# Patient Record
Sex: Female | Born: 1960 | Race: Black or African American | Hispanic: No
Health system: Southern US, Community
[De-identification: ages and names within clinical notes are randomized; demographics above are authoritative.]

---

## 2020-08-21 ENCOUNTER — Emergency Department (HOSPITAL_COMMUNITY): Payer: BC Managed Care – PPO

## 2020-08-21 ENCOUNTER — Inpatient Hospital Stay (HOSPITAL_COMMUNITY): Payer: BC Managed Care – PPO

## 2020-08-21 ENCOUNTER — Inpatient Hospital Stay (HOSPITAL_COMMUNITY)
Admission: EM | Admit: 2020-08-21 | Discharge: 2020-09-06 | DRG: 208 | Disposition: E | Payer: BC Managed Care – PPO | Attending: Surgery | Admitting: Surgery

## 2020-08-21 DIAGNOSIS — G253 Myoclonus: Secondary | ICD-10-CM | POA: Diagnosis present

## 2020-08-21 DIAGNOSIS — R34 Anuria and oliguria: Secondary | ICD-10-CM | POA: Diagnosis present

## 2020-08-21 DIAGNOSIS — R569 Unspecified convulsions: Secondary | ICD-10-CM | POA: Diagnosis not present

## 2020-08-21 DIAGNOSIS — S272XXA Traumatic hemopneumothorax, initial encounter: Principal | ICD-10-CM | POA: Diagnosis present

## 2020-08-21 DIAGNOSIS — R578 Other shock: Secondary | ICD-10-CM | POA: Diagnosis present

## 2020-08-21 DIAGNOSIS — S2242XA Multiple fractures of ribs, left side, initial encounter for closed fracture: Secondary | ICD-10-CM | POA: Diagnosis present

## 2020-08-21 DIAGNOSIS — Y92410 Unspecified street and highway as the place of occurrence of the external cause: Secondary | ICD-10-CM | POA: Diagnosis not present

## 2020-08-21 DIAGNOSIS — N179 Acute kidney failure, unspecified: Secondary | ICD-10-CM | POA: Diagnosis present

## 2020-08-21 DIAGNOSIS — S27332A Laceration of lung, bilateral, initial encounter: Secondary | ICD-10-CM | POA: Diagnosis present

## 2020-08-21 DIAGNOSIS — R40243 Glasgow coma scale score 3-8, unspecified time: Secondary | ICD-10-CM | POA: Diagnosis present

## 2020-08-21 DIAGNOSIS — Z7189 Other specified counseling: Secondary | ICD-10-CM

## 2020-08-21 DIAGNOSIS — I639 Cerebral infarction, unspecified: Secondary | ICD-10-CM | POA: Diagnosis present

## 2020-08-21 DIAGNOSIS — T1490XA Injury, unspecified, initial encounter: Secondary | ICD-10-CM

## 2020-08-21 DIAGNOSIS — I469 Cardiac arrest, cause unspecified: Secondary | ICD-10-CM | POA: Diagnosis present

## 2020-08-21 DIAGNOSIS — J942 Hemothorax: Secondary | ICD-10-CM

## 2020-08-21 DIAGNOSIS — S022XXA Fracture of nasal bones, initial encounter for closed fracture: Secondary | ICD-10-CM | POA: Diagnosis present

## 2020-08-21 DIAGNOSIS — T797XXA Traumatic subcutaneous emphysema, initial encounter: Secondary | ICD-10-CM | POA: Diagnosis present

## 2020-08-21 DIAGNOSIS — E874 Mixed disorder of acid-base balance: Secondary | ICD-10-CM | POA: Diagnosis present

## 2020-08-21 DIAGNOSIS — S02401A Maxillary fracture, unspecified, initial encounter for closed fracture: Secondary | ICD-10-CM | POA: Diagnosis present

## 2020-08-21 DIAGNOSIS — G40901 Epilepsy, unspecified, not intractable, with status epilepticus: Secondary | ICD-10-CM | POA: Diagnosis not present

## 2020-08-21 DIAGNOSIS — U071 COVID-19: Secondary | ICD-10-CM | POA: Diagnosis present

## 2020-08-21 DIAGNOSIS — J9601 Acute respiratory failure with hypoxia: Secondary | ICD-10-CM | POA: Diagnosis present

## 2020-08-21 DIAGNOSIS — S02831A Fracture of medial orbital wall, right side, initial encounter for closed fracture: Secondary | ICD-10-CM | POA: Diagnosis present

## 2020-08-21 DIAGNOSIS — Z8673 Personal history of transient ischemic attack (TIA), and cerebral infarction without residual deficits: Secondary | ICD-10-CM

## 2020-08-21 DIAGNOSIS — J9382 Other air leak: Secondary | ICD-10-CM | POA: Diagnosis present

## 2020-08-21 DIAGNOSIS — Z09 Encounter for follow-up examination after completed treatment for conditions other than malignant neoplasm: Secondary | ICD-10-CM

## 2020-08-21 DIAGNOSIS — T50905A Adverse effect of unspecified drugs, medicaments and biological substances, initial encounter: Secondary | ICD-10-CM | POA: Diagnosis not present

## 2020-08-21 DIAGNOSIS — G928 Other toxic encephalopathy: Secondary | ICD-10-CM | POA: Diagnosis not present

## 2020-08-21 DIAGNOSIS — J939 Pneumothorax, unspecified: Secondary | ICD-10-CM

## 2020-08-21 DIAGNOSIS — G931 Anoxic brain damage, not elsewhere classified: Secondary | ICD-10-CM | POA: Diagnosis present

## 2020-08-21 DIAGNOSIS — Z4659 Encounter for fitting and adjustment of other gastrointestinal appliance and device: Secondary | ICD-10-CM

## 2020-08-21 DIAGNOSIS — J9690 Respiratory failure, unspecified, unspecified whether with hypoxia or hypercapnia: Secondary | ICD-10-CM

## 2020-08-21 DIAGNOSIS — I4901 Ventricular fibrillation: Secondary | ICD-10-CM | POA: Diagnosis present

## 2020-08-21 LAB — I-STAT ARTERIAL BLOOD GAS, ED
Acid-base deficit: 23 mmol/L — ABNORMAL HIGH (ref 0.0–2.0)
Bicarbonate: 12.7 mmol/L — ABNORMAL LOW (ref 20.0–28.0)
Calcium, Ion: 1.02 mmol/L — ABNORMAL LOW (ref 1.15–1.40)
HCT: 41 % (ref 36.0–46.0)
Hemoglobin: 13.9 g/dL (ref 12.0–15.0)
O2 Saturation: 100 %
Patient temperature: 89
Potassium: 5 mmol/L (ref 3.5–5.1)
Sodium: 140 mmol/L (ref 135–145)
TCO2: 15 mmol/L — ABNORMAL LOW (ref 22–32)
pCO2 arterial: 60.7 mmHg — ABNORMAL HIGH (ref 32.0–48.0)
pH, Arterial: 6.884 — CL (ref 7.350–7.450)
pO2, Arterial: 328 mmHg — ABNORMAL HIGH (ref 83.0–108.0)

## 2020-08-21 LAB — POCT I-STAT 7, (LYTES, BLD GAS, ICA,H+H)
Acid-base deficit: 23 mmol/L — ABNORMAL HIGH (ref 0.0–2.0)
Bicarbonate: 10.9 mmol/L — ABNORMAL LOW (ref 20.0–28.0)
Calcium, Ion: 1.03 mmol/L — ABNORMAL LOW (ref 1.15–1.40)
HCT: 46 % (ref 36.0–46.0)
Hemoglobin: 15.6 g/dL — ABNORMAL HIGH (ref 12.0–15.0)
O2 Saturation: 85 %
Patient temperature: 88.9
Potassium: 5.5 mmol/L — ABNORMAL HIGH (ref 3.5–5.1)
Sodium: 141 mmol/L (ref 135–145)
TCO2: 13 mmol/L — ABNORMAL LOW (ref 22–32)
pCO2 arterial: 43.5 mmHg (ref 32.0–48.0)
pH, Arterial: 6.965 — CL (ref 7.350–7.450)
pO2, Arterial: 59 mmHg — ABNORMAL LOW (ref 83.0–108.0)

## 2020-08-21 LAB — COMPREHENSIVE METABOLIC PANEL
ALT: 34 U/L (ref 0–44)
AST: 140 U/L — ABNORMAL HIGH (ref 15–41)
Albumin: 2.3 g/dL — ABNORMAL LOW (ref 3.5–5.0)
Alkaline Phosphatase: 77 U/L (ref 38–126)
Anion gap: 23 — ABNORMAL HIGH (ref 5–15)
BUN: 19 mg/dL (ref 6–20)
CO2: 13 mmol/L — ABNORMAL LOW (ref 22–32)
Calcium: 7.2 mg/dL — ABNORMAL LOW (ref 8.9–10.3)
Chloride: 108 mmol/L (ref 98–111)
Creatinine, Ser: 1.51 mg/dL — ABNORMAL HIGH (ref 0.44–1.00)
GFR, Estimated: 40 mL/min — ABNORMAL LOW (ref >60)
Glucose, Bld: 216 mg/dL — ABNORMAL HIGH (ref 70–99)
Potassium: 4.3 mmol/L (ref 3.5–5.1)
Sodium: 144 mmol/L (ref 135–145)
Total Bilirubin: 0.8 mg/dL (ref 0.3–1.2)
Total Protein: 4.1 g/dL — ABNORMAL LOW (ref 6.5–8.1)

## 2020-08-21 LAB — CBC
HCT: 38.3 % (ref 36.0–46.0)
HCT: 47.6 % — ABNORMAL HIGH (ref 36.0–46.0)
Hemoglobin: 12 g/dL (ref 12.0–15.0)
Hemoglobin: 14.8 g/dL (ref 12.0–15.0)
MCH: 32.5 pg (ref 26.0–34.0)
MCH: 29.2 pg (ref 26.0–34.0)
MCHC: 31.3 g/dL (ref 30.0–36.0)
MCHC: 31.1 g/dL (ref 30.0–36.0)
MCV: 103.8 fL — ABNORMAL HIGH (ref 80.0–100.0)
MCV: 94.1 fL (ref 80.0–100.0)
Platelets: 141 10*3/uL — ABNORMAL LOW (ref 150–400)
Platelets: 163 10*3/uL (ref 150–400)
RBC: 3.69 MIL/uL — ABNORMAL LOW (ref 3.87–5.11)
RBC: 5.06 MIL/uL (ref 3.87–5.11)
RDW: 14.2 % (ref 11.5–15.5)
RDW: 18.4 % — ABNORMAL HIGH (ref 11.5–15.5)
WBC: 7.4 10*3/uL (ref 4.0–10.5)
WBC: 16.2 10*3/uL — ABNORMAL HIGH (ref 4.0–10.5)
nRBC: 0.4 % — ABNORMAL HIGH (ref 0.0–0.2)
nRBC: 0 % (ref 0.0–0.2)

## 2020-08-21 LAB — URINALYSIS, ROUTINE W REFLEX MICROSCOPIC
Bacteria, UA: NONE SEEN
Bilirubin Urine: NEGATIVE
Glucose, UA: 50 mg/dL — AB
Ketones, ur: NEGATIVE mg/dL
Leukocytes,Ua: NEGATIVE
Nitrite: NEGATIVE
Protein, ur: 100 mg/dL — AB
Specific Gravity, Urine: 1.015 (ref 1.005–1.030)
pH: 6 (ref 5.0–8.0)

## 2020-08-21 LAB — HIV ANTIBODY (ROUTINE TESTING W REFLEX): HIV Screen 4th Generation wRfx: NONREACTIVE

## 2020-08-21 LAB — PROTIME-INR
INR: 1.5 — ABNORMAL HIGH (ref 0.8–1.2)
Prothrombin Time: 17.4 seconds — ABNORMAL HIGH (ref 11.4–15.2)

## 2020-08-21 LAB — I-STAT CHEM 8, ED
BUN: 22 mg/dL — ABNORMAL HIGH (ref 6–20)
Calcium, Ion: 0.77 mmol/L — CL (ref 1.15–1.40)
Chloride: 108 mmol/L (ref 98–111)
Creatinine, Ser: 1.5 mg/dL — ABNORMAL HIGH (ref 0.44–1.00)
Glucose, Bld: 198 mg/dL — ABNORMAL HIGH (ref 70–99)
HCT: 33 % — ABNORMAL LOW (ref 36.0–46.0)
Hemoglobin: 11.2 g/dL — ABNORMAL LOW (ref 12.0–15.0)
Potassium: 4.2 mmol/L (ref 3.5–5.1)
Sodium: 143 mmol/L (ref 135–145)
TCO2: 16 mmol/L — ABNORMAL LOW (ref 22–32)

## 2020-08-21 LAB — RESP PANEL BY RT-PCR (FLU A&B, COVID) ARPGX2
Influenza A by PCR: NEGATIVE
Influenza B by PCR: NEGATIVE
SARS Coronavirus 2 by RT PCR: POSITIVE — AB

## 2020-08-21 LAB — ETHANOL: Alcohol, Ethyl (B): 238 mg/dL — ABNORMAL HIGH (ref <10)

## 2020-08-21 LAB — ABO/RH: ABO/RH(D): B POS

## 2020-08-21 MED ORDER — LEVETIRACETAM IN NACL 500 MG/100ML IV SOLN
500.0000 mg | Freq: Two times a day (BID) | INTRAVENOUS | Status: DC
  Administered 2020-08-21 – 2020-08-22 (×2): 500 mg via INTRAVENOUS
  Filled 2020-08-21 (×3): qty 100

## 2020-08-21 MED ORDER — IOHEXOL 300 MG/ML  SOLN
100.0000 mL | Freq: Once | INTRAMUSCULAR | Status: AC | PRN
  Administered 2020-08-21: 100 mL via INTRAVENOUS

## 2020-08-21 MED ORDER — ENOXAPARIN SODIUM 30 MG/0.3ML ~~LOC~~ SOLN
30.0000 mg | Freq: Two times a day (BID) | SUBCUTANEOUS | Status: DC
  Administered 2020-08-23 (×2): 30 mg via SUBCUTANEOUS
  Filled 2020-08-21 (×2): qty 0.3

## 2020-08-21 MED ORDER — SODIUM CHLORIDE 0.9 % IV SOLN: 250.0000 mL | INTRAVENOUS | Status: DC

## 2020-08-21 MED ORDER — SODIUM CHLORIDE 0.9 % IV SOLN: INTRAVENOUS | Status: DC | PRN

## 2020-08-21 MED ORDER — METOPROLOL TARTRATE 5 MG/5ML IV SOLN
5.0000 mg | Freq: Four times a day (QID) | INTRAVENOUS | Status: DC | PRN
  Administered 2020-08-23: 5 mg via INTRAVENOUS
  Filled 2020-08-21 (×3): qty 5

## 2020-08-21 MED ORDER — ORAL CARE MOUTH RINSE
15.0000 mL | OROMUCOSAL | Status: DC
  Administered 2020-08-22 – 2020-08-24 (×24): 15 mL via OROMUCOSAL

## 2020-08-21 MED ORDER — FENTANYL CITRATE (PF) 100 MCG/2ML IJ SOLN
50.0000 ug | INTRAMUSCULAR | Status: DC | PRN
  Administered 2020-08-22 (×2): 50 ug via INTRAVENOUS
  Filled 2020-08-21: qty 2

## 2020-08-21 MED ORDER — PROPOFOL 1000 MG/100ML IV EMUL
0.0000 ug/kg/min | INTRAVENOUS | Status: DC
  Administered 2020-08-22 (×2): 5 ug/kg/min via INTRAVENOUS
  Administered 2020-08-23: 20 ug/kg/min via INTRAVENOUS
  Administered 2020-08-23: 10 ug/kg/min via INTRAVENOUS
  Filled 2020-08-21 (×6): qty 100

## 2020-08-21 MED ORDER — SODIUM CHLORIDE 0.9 % IV SOLN: INTRAVENOUS | Status: DC

## 2020-08-21 MED ORDER — NOREPINEPHRINE 4 MG/250ML-% IV SOLN
2.0000 ug/min | INTRAVENOUS | Status: DC
  Administered 2020-08-21: 2 ug/min via INTRAVENOUS
  Administered 2020-08-22: 10 ug/min via INTRAVENOUS
  Administered 2020-08-23: 2 ug/min via INTRAVENOUS
  Filled 2020-08-21 (×3): qty 250

## 2020-08-21 MED ORDER — FENTANYL CITRATE (PF) 100 MCG/2ML IJ SOLN
50.0000 ug | INTRAMUSCULAR | Status: DC | PRN
  Administered 2020-08-22: 100 ug via INTRAVENOUS
  Filled 2020-08-21: qty 4
  Filled 2020-08-21: qty 2

## 2020-08-21 MED ORDER — PANTOPRAZOLE SODIUM 40 MG PO TBEC
40.0000 mg | DELAYED_RELEASE_TABLET | Freq: Every day | ORAL | Status: DC
  Filled 2020-08-21: qty 1

## 2020-08-21 MED ORDER — POLYETHYLENE GLYCOL 3350 17 G PO PACK
17.0000 g | PACK | Freq: Every day | ORAL | Status: DC
  Filled 2020-08-21 (×2): qty 1

## 2020-08-21 MED ORDER — ONDANSETRON HCL 4 MG/2ML IJ SOLN: 4.0000 mg | Freq: Four times a day (QID) | INTRAMUSCULAR | Status: DC | PRN

## 2020-08-21 MED ORDER — DOCUSATE SODIUM 50 MG/5ML PO LIQD
100.0000 mg | Freq: Two times a day (BID) | ORAL | Status: DC
  Administered 2020-08-23: 100 mg
  Filled 2020-08-21 (×4): qty 10

## 2020-08-21 MED ORDER — CHLORHEXIDINE GLUCONATE 0.12% ORAL RINSE (MEDLINE KIT)
15.0000 mL | Freq: Two times a day (BID) | OROMUCOSAL | Status: DC
  Administered 2020-08-21 – 2020-08-23 (×5): 15 mL via OROMUCOSAL

## 2020-08-21 MED ORDER — PANTOPRAZOLE SODIUM 40 MG IV SOLR
40.0000 mg | Freq: Every day | INTRAVENOUS | Status: DC
  Administered 2020-08-21 – 2020-08-23 (×3): 40 mg via INTRAVENOUS
  Filled 2020-08-21 (×3): qty 40

## 2020-08-21 MED ORDER — CHLORHEXIDINE GLUCONATE CLOTH 2 % EX PADS
6.0000 | MEDICATED_PAD | Freq: Every day | CUTANEOUS | Status: DC
  Administered 2020-08-21 – 2020-08-23 (×2): 6 via TOPICAL

## 2020-08-21 MED ORDER — SODIUM CHLORIDE 0.9 % IV SOLN
INTRAVENOUS | Status: AC | PRN
  Administered 2020-08-21: 1000 mL via INTRAVENOUS

## 2020-08-21 MED ORDER — LORAZEPAM 2 MG/ML IJ SOLN
INTRAMUSCULAR | Status: AC
  Administered 2020-08-21: 2 mg
  Filled 2020-08-21: qty 1

## 2020-08-21 MED ORDER — ONDANSETRON 4 MG PO TBDP: 4.0000 mg | ORAL_TABLET | Freq: Four times a day (QID) | ORAL | Status: DC | PRN
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
## 2020-08-21 NOTE — Progress Notes (Signed)
Assisted MD with intubation.  Pt tolerated well. BBS equal, SPO2 100% good color change ETCO2 detector, 7.5/25 lip

## 2020-08-21 NOTE — ED Notes (Signed)
Pt's sister found Krista Hernandez, ID matched with 2 identifiers. [id number]. She was only advised pt is in the hospital after [facility] and will by on 4N

## 2020-08-21 NOTE — Progress Notes (Signed)
Patient transported to CT and back to [facility] without event.

## 2020-08-21 NOTE — ED Notes (Signed)
ABG collected by RT

## 2020-08-21 NOTE — ED Notes (Signed)
Pt ready for CT, delayed due to RT shift change.
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
## 2020-08-21 NOTE — ED Notes (Signed)
Lactic acid greater than 11. Autumn, RN notified.

## 2020-08-21 NOTE — Progress Notes (Signed)
[place] notified with GCS of 3, on vent, Referral # [id number]
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
## 2020-08-21 NOTE — ED Notes (Addendum)
L sided facial twitching noted with intermittent bilat eye opening, Dr. Janee Morn notified. Orders received for seizure prophylaxis

## 2020-08-21 NOTE — TOC CAGE-AID Note (Signed)
Transition of Care [facility]) - CAGE-AID Screening   Patient Details  Name: Krista Hernandez MRN: [id number] Date of Birth: 02/04/1961   Judie Bonus, RN Phone Number: Aug 30, 2020, 11:36 PM   Clinical Narrative:  Pt unable to participate, intubated, GCS 3   CAGE-AID Screening: Substance Abuse Screening unable to be completed due to: : Patient unable to participate             Substance Abuse Education Offered: No

## 2020-08-21 NOTE — ED Notes (Signed)
1g txa given IV, started at 1840, completed at 1850.

## 2020-08-21 NOTE — Procedures (Signed)
Chest tube insertion  Date/Time: 08/15/2020 7:10 PM Performed by: Violeta Gelinas, MD Authorized by: Violeta Gelinas, MD   Consent:    Consent obtained:  Emergent situation Pre-procedure details:    Skin preparation:  ChloraPrep Procedure details:    Placement location:  R lateral   Scalpel size:  11   Technique: Seldinger     Ultrasound guidance: no     Tension pneumothorax: no     Tube connected to:  Suction   Drainage characteristics:  Bloody   Dressing:  4x4 sterile gauze Post-procedure details:    Post-insertion x-ray findings: tube in good position   Comments:     54frpigtail, blood and air returned.   Violeta Gelinas, MD, MPH, FACS Please use [email or web address] to contact on call provider

## 2020-08-21 NOTE — Procedures (Signed)
Arterial Catheter Insertion Procedure Note  Krista Hernandez  [id number]  1960-11-24  Date:08/31/2020  Time:10:48 PM    Provider Performing: Inez Pilgrim    Procedure: Insertion of Arterial Line (20100) without US guidance  Indication(s) Blood pressure monitoring and/or need for frequent ABGs  Consent Unable to obtain consent due to emergent nature of procedure.  Anesthesia None   Time Out Verified patient identification, verified procedure, site/side was marked, verified correct patient position, special equipment/implants available, medications/allergies/relevant history reviewed, required imaging and test results available.   Sterile Technique Maximal sterile technique including full sterile barrier drape, hand hygiene, sterile gown, sterile gloves, mask, hair covering, sterile ultrasound probe cover (if used).   Procedure Description Area of catheter insertion was cleaned with chlorhexidine and draped in sterile fashion. Without real-time ultrasound guidance an arterial catheter was placed into the right radial artery.  Appropriate arterial tracings confirmed on monitor.     Complications/Tolerance None; patient tolerated the procedure well.   EBL Minimal   Specimen(s) None

## 2020-08-21 NOTE — Progress Notes (Signed)
Patient COVID results came back positive. All precaution orders placed. ED staff notified.

## 2020-08-21 NOTE — Progress Notes (Signed)
Patient transported from ED to 4N28 without event.

## 2020-08-21 NOTE — ED Notes (Signed)
Pt transported to CT with Autumn, TRN.  

## 2020-08-21 NOTE — Progress Notes (Signed)
SBP 70s, Dr. Renaldo Fiddler notified, orders received for levo, repeat CXR, and a-line, will continue to monitor.
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
## 2020-08-22 ENCOUNTER — Inpatient Hospital Stay: Payer: Self-pay

## 2020-08-22 ENCOUNTER — Inpatient Hospital Stay (HOSPITAL_COMMUNITY): Payer: BC Managed Care – PPO

## 2020-08-22 ENCOUNTER — Encounter (HOSPITAL_COMMUNITY): Payer: Self-pay | Admitting: General Surgery

## 2020-08-22 DIAGNOSIS — R569 Unspecified convulsions: Secondary | ICD-10-CM

## 2020-08-22 LAB — BASIC METABOLIC PANEL
Anion gap: 16 — ABNORMAL HIGH (ref 5–15)
BUN: 25 mg/dL — ABNORMAL HIGH (ref 6–20)
CO2: 15 mmol/L — ABNORMAL LOW (ref 22–32)
Calcium: 6.6 mg/dL — ABNORMAL LOW (ref 8.9–10.3)
Chloride: 114 mmol/L — ABNORMAL HIGH (ref 98–111)
Creatinine, Ser: 1.92 mg/dL — ABNORMAL HIGH (ref 0.44–1.00)
GFR, Estimated: 30 mL/min — ABNORMAL LOW (ref >60)
Glucose, Bld: 86 mg/dL (ref 70–99)
Potassium: 3.9 mmol/L (ref 3.5–5.1)
Sodium: 145 mmol/L (ref 135–145)

## 2020-08-22 LAB — CBC
HCT: 40.1 % (ref 36.0–46.0)
Hemoglobin: 12.7 g/dL (ref 12.0–15.0)
MCH: 29.5 pg (ref 26.0–34.0)
MCHC: 31.7 g/dL (ref 30.0–36.0)
MCV: 93 fL (ref 80.0–100.0)
Platelets: 120 10*3/uL — ABNORMAL LOW (ref 150–400)
RBC: 4.31 MIL/uL (ref 3.87–5.11)
RDW: 20.1 % — ABNORMAL HIGH (ref 11.5–15.5)
WBC: 13 10*3/uL — ABNORMAL HIGH (ref 4.0–10.5)
nRBC: 0 % (ref 0.0–0.2)

## 2020-08-22 LAB — POCT I-STAT 7, (LYTES, BLD GAS, ICA,H+H)
Acid-base deficit: 17 mmol/L — ABNORMAL HIGH (ref 0.0–2.0)
Bicarbonate: 13.2 mmol/L — ABNORMAL LOW (ref 20.0–28.0)
Calcium, Ion: 1.02 mmol/L — ABNORMAL LOW (ref 1.15–1.40)
HCT: 38 % (ref 36.0–46.0)
Hemoglobin: 12.9 g/dL (ref 12.0–15.0)
O2 Saturation: 99 %
Patient temperature: 98.5
Potassium: 3.9 mmol/L (ref 3.5–5.1)
Sodium: 146 mmol/L — ABNORMAL HIGH (ref 135–145)
TCO2: 15 mmol/L — ABNORMAL LOW (ref 22–32)
pCO2 arterial: 48.1 mmHg — ABNORMAL HIGH (ref 32.0–48.0)
pH, Arterial: 7.045 — CL (ref 7.350–7.450)
pO2, Arterial: 174 mmHg — ABNORMAL HIGH (ref 83.0–108.0)

## 2020-08-22 LAB — GLUCOSE, CAPILLARY
Glucose-Capillary: 63 mg/dL — ABNORMAL LOW (ref 70–99)
Glucose-Capillary: 144 mg/dL — ABNORMAL HIGH (ref 70–99)
Glucose-Capillary: 123 mg/dL — ABNORMAL HIGH (ref 70–99)
Glucose-Capillary: 140 mg/dL — ABNORMAL HIGH (ref 70–99)

## 2020-08-22 LAB — LACTIC ACID, PLASMA: Lactic Acid, Venous: >11 mmol/L (ref 0.5–1.9)

## 2020-08-22 LAB — MRSA PCR SCREENING: MRSA by PCR: NEGATIVE

## 2020-08-22 LAB — TRIGLYCERIDES: Triglycerides: 281 mg/dL — ABNORMAL HIGH (ref <150)

## 2020-08-22 LAB — BLOOD PRODUCT ORDER (VERBAL) VERIFICATION

## 2020-08-22 MED ORDER — VASOPRESSIN 20 UNITS/100 ML INFUSION FOR SHOCK
0.0000 [IU]/min | INTRAVENOUS | Status: DC
  Filled 2020-08-22: qty 100

## 2020-08-22 MED ORDER — FENTANYL 2500MCG IN NS 250ML (10MCG/ML) PREMIX INFUSION
50.0000 ug/h | INTRAVENOUS | Status: DC
  Administered 2020-08-22: 25 ug/h via INTRAVENOUS
  Administered 2020-08-23: 75 ug/h via INTRAVENOUS
  Filled 2020-08-22 (×2): qty 250

## 2020-08-22 MED ORDER — SODIUM CHLORIDE 0.9 % IV BOLUS
500.0000 mL | INTRAVENOUS | Status: AC
  Administered 2020-08-22: 500 mL via INTRAVENOUS

## 2020-08-22 MED ORDER — IOHEXOL 350 MG/ML SOLN
50.0000 mL | Freq: Once | INTRAVENOUS | Status: AC | PRN
  Administered 2020-08-22: 50 mL via INTRAVENOUS

## 2020-08-22 MED ORDER — LEVETIRACETAM IN NACL 1000 MG/100ML IV SOLN: 1000.0000 mg | Freq: Two times a day (BID) | INTRAVENOUS | Status: DC

## 2020-08-22 MED ORDER — ALBUMIN HUMAN 5 % IV SOLN: 12.5000 g | INTRAVENOUS | Status: AC

## 2020-08-22 MED ORDER — LORAZEPAM 2 MG/ML IJ SOLN
INTRAMUSCULAR | Status: AC
  Administered 2020-08-22: 1 mg via INTRAVENOUS
  Filled 2020-08-22: qty 1

## 2020-08-22 MED ORDER — LEVETIRACETAM IN NACL 1500 MG/100ML IV SOLN
1500.0000 mg | Freq: Two times a day (BID) | INTRAVENOUS | Status: DC
  Administered 2020-08-22 – 2020-08-23 (×3): 1500 mg via INTRAVENOUS
  Filled 2020-08-22 (×3): qty 100

## 2020-08-22 MED ORDER — IPRATROPIUM-ALBUTEROL 0.5-2.5 (3) MG/3ML IN SOLN
3.0000 mL | Freq: Three times a day (TID) | RESPIRATORY_TRACT | Status: DC
  Administered 2020-08-22 – 2020-08-23 (×4): 3 mL via RESPIRATORY_TRACT
  Filled 2020-08-22 (×3): qty 3

## 2020-08-22 MED ORDER — SODIUM CHLORIDE 0.9 % IV BOLUS
1000.0000 mL | Freq: Once | INTRAVENOUS | Status: AC
  Administered 2020-08-22: 1000 mL via INTRAVENOUS

## 2020-08-22 MED ORDER — IPRATROPIUM-ALBUTEROL 0.5-2.5 (3) MG/3ML IN SOLN
3.0000 mL | Freq: Four times a day (QID) | RESPIRATORY_TRACT | Status: DC | PRN
  Administered 2020-08-24: 04:00:00 3 mL via RESPIRATORY_TRACT
  Filled 2020-08-22: qty 3

## 2020-08-22 MED ORDER — MIDAZOLAM HCL 2 MG/2ML IJ SOLN: 2.0000 mg | Freq: Once | INTRAMUSCULAR | Status: AC

## 2020-08-22 MED ORDER — MIDAZOLAM HCL 2 MG/2ML IJ SOLN
1.0000 mg | INTRAMUSCULAR | Status: AC
  Administered 2020-08-22: 1 mg via INTRAVENOUS
  Filled 2020-08-22: qty 2

## 2020-08-22 MED ORDER — VITAL HIGH PROTEIN PO LIQD: 1000.0000 mL | ORAL | Status: DC

## 2020-08-22 MED ORDER — LORAZEPAM 2 MG/ML IJ SOLN
1.0000 mg | INTRAMUSCULAR | Status: DC | PRN
  Filled 2020-08-22: qty 1

## 2020-08-22 MED ORDER — GADOBUTROL 1 MMOL/ML IV SOLN
10.0000 mL | Freq: Once | INTRAVENOUS | Status: AC | PRN
  Administered 2020-08-22: 10 mL via INTRAVENOUS

## 2020-08-22 MED ORDER — SODIUM CHLORIDE 0.9% FLUSH: 10.0000 mL | INTRAVENOUS | Status: DC | PRN

## 2020-08-22 MED ORDER — ALBUMIN HUMAN 5 % IV SOLN
INTRAVENOUS | Status: AC
  Administered 2020-08-22: 12.5 g via INTRAVENOUS
  Filled 2020-08-22: qty 250

## 2020-08-22 MED ORDER — ACETAMINOPHEN 325 MG PO TABS
650.0000 mg | ORAL_TABLET | Freq: Four times a day (QID) | ORAL | Status: DC | PRN
  Administered 2020-08-23 (×3): 650 mg
  Filled 2020-08-22 (×3): qty 2

## 2020-08-22 MED ORDER — NITROGLYCERIN 2 % TD OINT
1.0000 [in_us] | TOPICAL_OINTMENT | Freq: Three times a day (TID) | TRANSDERMAL | Status: AC
  Administered 2020-08-22 – 2020-08-23 (×5): 1 [in_us] via TOPICAL
  Filled 2020-08-22: qty 30

## 2020-08-22 MED ORDER — SODIUM CHLORIDE 0.9% FLUSH
10.0000 mL | Freq: Two times a day (BID) | INTRAVENOUS | Status: DC
  Administered 2020-08-22 – 2020-08-23 (×2): 10 mL

## 2020-08-22 MED ORDER — PHENTOLAMINE MESYLATE 5 MG IJ SOLR
5.0000 mg | Freq: Once | INTRAMUSCULAR | Status: AC
  Administered 2020-08-22: 5 mg via SUBCUTANEOUS
  Filled 2020-08-22: qty 5

## 2020-08-22 MED ORDER — IPRATROPIUM-ALBUTEROL 0.5-2.5 (3) MG/3ML IN SOLN
RESPIRATORY_TRACT | Status: AC
  Filled 2020-08-22: qty 3

## 2020-08-22 MED ORDER — CALCIUM GLUCONATE-NACL 1-0.675 GM/50ML-% IV SOLN
1.0000 g | Freq: Once | INTRAVENOUS | Status: AC
  Administered 2020-08-22: 1000 mg via INTRAVENOUS
  Filled 2020-08-22: qty 50

## 2020-08-22 MED ORDER — ALBUMIN HUMAN 5 % IV SOLN
12.5000 g | Freq: Once | INTRAVENOUS | Status: AC
  Administered 2020-08-22: 12.5 g via INTRAVENOUS
  Filled 2020-08-22: qty 250

## 2020-08-22 MED ORDER — LEVETIRACETAM IN NACL 1000 MG/100ML IV SOLN
1000.0000 mg | INTRAVENOUS | Status: AC
  Administered 2020-08-22: 1000 mg via INTRAVENOUS
  Filled 2020-08-22: qty 100

## 2020-08-22 MED ORDER — DEXTROSE 50 % IV SOLN
1.0000 | Freq: Once | INTRAVENOUS | Status: AC
  Administered 2020-08-22: 50 mL via INTRAVENOUS

## 2020-08-22 MED ORDER — PROSOURCE TF PO LIQD
45.0000 mL | Freq: Two times a day (BID) | ORAL | Status: DC
  Filled 2020-08-22: qty 45

## 2020-08-22 MED ORDER — ACETAMINOPHEN 325 MG PO TABS: 650.0000 mg | ORAL_TABLET | Freq: Four times a day (QID) | ORAL | Status: DC | PRN

## 2020-08-22 MED ORDER — LORAZEPAM 2 MG/ML IJ SOLN: 1.0000 mg | INTRAMUSCULAR | Status: AC

## 2020-08-22 MED ORDER — PIVOT 1.5 CAL PO LIQD
1000.0000 mL | ORAL | Status: DC
  Administered 2020-08-22: 1000 mL
  Filled 2020-08-22: qty 1000

## 2020-08-22 MED ORDER — MIDAZOLAM HCL 2 MG/2ML IJ SOLN
INTRAMUSCULAR | Status: AC
  Administered 2020-08-22: 2 mg
  Filled 2020-08-22: qty 2

## 2020-08-22 MED ORDER — DEXTROSE 50 % IV SOLN
INTRAVENOUS | Status: AC
  Administered 2020-08-22: 50 mL
  Filled 2020-08-22: qty 50

## 2020-08-22 MED ORDER — LEVETIRACETAM IN NACL 500 MG/100ML IV SOLN
500.0000 mg | Freq: Once | INTRAVENOUS | Status: AC
  Administered 2020-08-22: 500 mg via INTRAVENOUS
  Filled 2020-08-22: qty 100

## 2020-08-22 NOTE — Progress Notes (Signed)
Pt having rhythmic twitching of eye and foot, for about 30 seconds then stops for a couple minutes and starts again, Dr. Dwain Sarna aware PRN ativan and loading dose keppra ordered.

## 2020-08-22 NOTE — Progress Notes (Signed)
Rhythmic foot twitiching continues Dr. Dwain Sarna aware, 1mg  versed given, twitching continues, Dr, aware.

## 2020-08-22 NOTE — Progress Notes (Signed)
Low BP MAP 40s after starting propofol and giving fentanyl due to peak pressuring and breathing 60 times a minute  Dr. Donne Hazel notified aware of levo rate, more albumin give it as fast as possible per Dr. Donne Hazel, 500 cc bolus of NS, decreased propofol rate, and order vaso if needed.   Right upper arm IV infiltrated with levo and bolus running, extravasation kit given. Will continue to monitor.

## 2020-08-22 NOTE — Progress Notes (Signed)
Peripherally Inserted Central Catheter Placement  The IV Nurse has discussed with the patient and/or persons authorized to consent for the patient, the purpose of this procedure and the potential benefits and risks involved with this procedure.  The benefits include less needle sticks, lab draws from the catheter, and the patient may be discharged home with the catheter. Risks include, but not limited to, infection, bleeding, blood clot (thrombus formation), and puncture of an artery; nerve damage and irregular heartbeat and possibility to perform a PICC exchange if needed/ordered by physician.  Alternatives to this procedure were also discussed.  Bard Power PICC patient education guide, fact sheet on infection prevention and patient information card has been provided to patient /or left at bedside.    PICC Placement Documentation  PICC Triple Lumen 08/22/20 PICC Left Basilic 44 cm (Active)  Indication for Insertion or Continuance of Line Vasoactive infusions 08/22/20 1800  Site Assessment Clean;Dry;Intact 08/22/20 1800  Lumen #1 Status Flushed;Blood return noted 08/22/20 1800  Lumen #2 Status Flushed;Blood return noted 08/22/20 1800  Lumen #3 Status Flushed;Blood return noted 08/22/20 1800  Dressing Type Transparent 08/22/20 1800  Dressing Status Clean;Dry;Intact 08/22/20 1800  Antimicrobial disc in place? Yes 08/22/20 1800  Dressing Intervention New dressing 08/22/20 1800  Dressing Change Due 08/29/20 08/22/20 1800    Telephone consent   Stacie Glaze Horton 08/22/2020, 6:34 PM
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
## 2020-08-22 NOTE — Progress Notes (Signed)
Clinical update provided to patients' two daughters, Epiphany Orvan Falconer an Illene Labrador, as well as the patient's sister, Thayer Dallas via conference call. Updates provided regarding poor neurologic state currently, results of CT and MRI imaging performed today provided. Discussed pending EEG and neurology consult. Discussed inability to prognosticate at this time and that we will see how she does over the coming days. Discussed engaging palliative care to aid in support and decision-making in the future, should her neurologic function not improve. All three were open to having this conversation, will continue to assess patient's progress.   Diamantina Monks, MD General and Trauma Surgery [facility]

## 2020-08-22 NOTE — Procedures (Signed)
Cortrak  Person Inserting Tube:  Tamecia Mcdougald, Wenda Low, RD Tube Type:  Cortrak - 43 inches Tube Location:  Left nare Initial Placement:  Stomach Secured by: Bridle Technique Used to Measure Tube Placement:  Documented cm marking at nare/ corner of mouth Cortrak Secured At:  79 cm   X-ray is required, abdominal x-ray has been ordered by the Cortrak team. Please confirm tube placement before using the Cortrak tube.   If the tube becomes dislodged please keep the tube and contact the Cortrak team at [email or web address] ([email or web address]) for replacement.  If after hours and replacement cannot be delayed, place a NG tube and confirm placement with an abdominal x-ray.    Vanessa Kick RD, LDN Clinical Nutrition Pager listed in AMION
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
## 2020-08-22 NOTE — Progress Notes (Signed)
ETT tube pulled back 1 cm per MD order. Now secured at 25 cm.

## 2020-08-22 NOTE — Progress Notes (Signed)
vLTM EEG started following spot EEG (same leads) notified neuro °
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
## 2020-08-22 NOTE — Progress Notes (Signed)
RT transported patient from 4N28 to CT, then from CT to MRI, then MRI back to 4N28 with RN.  No complications, RT will continue to monitor.

## 2020-08-22 NOTE — Progress Notes (Signed)
Pt having jerking of b/l LE ? seizure vs myoclonic jerking, Dr. Renaldo Fiddler paged. New orders received, 1 mg ativan given, jerking remains unchanged.

## 2020-08-22 NOTE — Progress Notes (Signed)
Twitiching involving face, b/l UE, and RLE lasting around 2 minutes, Dr. Dwain Sarna aware 1mg  ativan given, twitching resolved.
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
## 2020-08-22 NOTE — Progress Notes (Signed)
EEG completed, results pending. 

## 2020-08-23 ENCOUNTER — Inpatient Hospital Stay (HOSPITAL_COMMUNITY): Payer: BC Managed Care – PPO

## 2020-08-23 DIAGNOSIS — Z7189 Other specified counseling: Secondary | ICD-10-CM

## 2020-08-23 DIAGNOSIS — G40901 Epilepsy, unspecified, not intractable, with status epilepticus: Secondary | ICD-10-CM

## 2020-08-23 LAB — POCT I-STAT 7, (LYTES, BLD GAS, ICA,H+H)
Acid-base deficit: 11 mmol/L — ABNORMAL HIGH (ref 0.0–2.0)
Bicarbonate: 19.2 mmol/L — ABNORMAL LOW (ref 20.0–28.0)
Calcium, Ion: 1 mmol/L — ABNORMAL LOW (ref 1.15–1.40)
HCT: 36 % (ref 36.0–46.0)
Hemoglobin: 12.2 g/dL (ref 12.0–15.0)
O2 Saturation: 91 %
Patient temperature: 38.8
Potassium: 4.3 mmol/L (ref 3.5–5.1)
Sodium: 149 mmol/L — ABNORMAL HIGH (ref 135–145)
TCO2: 21 mmol/L — ABNORMAL LOW (ref 22–32)
pCO2 arterial: 65.8 mmHg (ref 32.0–48.0)
pH, Arterial: 7.084 — CL (ref 7.350–7.450)
pO2, Arterial: 93 mmHg (ref 83.0–108.0)

## 2020-08-23 LAB — TYPE AND SCREEN
ABO/RH(D): B POS
Antibody Screen: NEGATIVE
Unit division: 0
Unit division: 0
Unit division: 0
Unit division: 0

## 2020-08-23 LAB — BPAM RBC
Blood Product Expiration Date: 202202222359
Blood Product Expiration Date: 202203012359
Blood Product Expiration Date: 202203172359
Blood Product Expiration Date: 202203172359
ISSUE DATE / TIME: 202202131834
ISSUE DATE / TIME: 202202131841
ISSUE DATE / TIME: 202202131847
ISSUE DATE / TIME: 202202131847
Unit Type and Rh: 5100
Unit Type and Rh: 5100
Unit Type and Rh: 5100
Unit Type and Rh: 5100

## 2020-08-23 LAB — BPAM FFP
Blood Product Expiration Date: 202202182359
Blood Product Expiration Date: 202202182359
Blood Product Expiration Date: 202202232359
ISSUE DATE / TIME: 202202131845
ISSUE DATE / TIME: 202202131847
ISSUE DATE / TIME: 202202131847
Unit Type and Rh: 6200
Unit Type and Rh: 6200
Unit Type and Rh: 6200

## 2020-08-23 LAB — COMPREHENSIVE METABOLIC PANEL
ALT: 53 U/L — ABNORMAL HIGH (ref 0–44)
AST: 115 U/L — ABNORMAL HIGH (ref 15–41)
Albumin: 2.6 g/dL — ABNORMAL LOW (ref 3.5–5.0)
Alkaline Phosphatase: 88 U/L (ref 38–126)
Anion gap: 10 (ref 5–15)
BUN: 22 mg/dL — ABNORMAL HIGH (ref 6–20)
CO2: 17 mmol/L — ABNORMAL LOW (ref 22–32)
Calcium: 6.5 mg/dL — ABNORMAL LOW (ref 8.9–10.3)
Chloride: 120 mmol/L — ABNORMAL HIGH (ref 98–111)
Creatinine, Ser: 1.03 mg/dL — ABNORMAL HIGH (ref 0.44–1.00)
GFR, Estimated: >60 mL/min (ref >60)
Glucose, Bld: 193 mg/dL — ABNORMAL HIGH (ref 70–99)
Potassium: 3.7 mmol/L (ref 3.5–5.1)
Sodium: 147 mmol/L — ABNORMAL HIGH (ref 135–145)
Total Bilirubin: 0.8 mg/dL (ref 0.3–1.2)
Total Protein: 5.1 g/dL — ABNORMAL LOW (ref 6.5–8.1)

## 2020-08-23 LAB — PREPARE FRESH FROZEN PLASMA: Unit division: 0

## 2020-08-23 LAB — CBC
HCT: 32.7 % — ABNORMAL LOW (ref 36.0–46.0)
Hemoglobin: 11.1 g/dL — ABNORMAL LOW (ref 12.0–15.0)
MCH: 30.5 pg (ref 26.0–34.0)
MCHC: 33.9 g/dL (ref 30.0–36.0)
MCV: 89.8 fL (ref 80.0–100.0)
Platelets: 71 10*3/uL — ABNORMAL LOW (ref 150–400)
RBC: 3.64 MIL/uL — ABNORMAL LOW (ref 3.87–5.11)
RDW: 19.9 % — ABNORMAL HIGH (ref 11.5–15.5)
WBC: 7.7 10*3/uL (ref 4.0–10.5)
nRBC: 0 % (ref 0.0–0.2)

## 2020-08-23 LAB — GLUCOSE, CAPILLARY
Glucose-Capillary: 144 mg/dL — ABNORMAL HIGH (ref 70–99)
Glucose-Capillary: 155 mg/dL — ABNORMAL HIGH (ref 70–99)
Glucose-Capillary: 160 mg/dL — ABNORMAL HIGH (ref 70–99)
Glucose-Capillary: 163 mg/dL — ABNORMAL HIGH (ref 70–99)
Glucose-Capillary: 163 mg/dL — ABNORMAL HIGH (ref 70–99)

## 2020-08-23 MED ORDER — METOPROLOL TARTRATE 5 MG/5ML IV SOLN
5.0000 mg | Freq: Once | INTRAVENOUS | Status: AC
  Administered 2020-08-23: 5 mg via INTRAVENOUS

## 2020-08-23 MED ORDER — SODIUM BICARBONATE 8.4 % IV SOLN
100.0000 meq | Freq: Once | INTRAVENOUS | Status: AC
  Administered 2020-08-23: 100 meq via INTRAVENOUS
  Filled 2020-08-23: qty 100

## 2020-08-23 MED ORDER — NOREPINEPHRINE 4 MG/250ML-% IV SOLN
0.0000 ug/min | INTRAVENOUS | Status: DC
  Administered 2020-08-23 (×2): 60 ug/min via INTRAVENOUS
  Administered 2020-08-23: 58 ug/min via INTRAVENOUS
  Administered 2020-08-23: 30 ug/min via INTRAVENOUS
  Administered 2020-08-23: 10 ug/min via INTRAVENOUS
  Administered 2020-08-24 (×5): 60 ug/min via INTRAVENOUS
  Filled 2020-08-23: qty 250
  Filled 2020-08-23 (×2): qty 500
  Filled 2020-08-23: qty 250
  Filled 2020-08-23: qty 500
  Filled 2020-08-23: qty 250

## 2020-08-23 MED ORDER — METOPROLOL TARTRATE 25 MG/10 ML ORAL SUSPENSION
25.0000 mg | Freq: Two times a day (BID) | ORAL | Status: DC
  Administered 2020-08-23: 25 mg
  Filled 2020-08-23: qty 10

## 2020-08-23 MED ORDER — SODIUM CHLORIDE 0.9 % IV SOLN
200.0000 mg | Freq: Once | INTRAVENOUS | Status: AC
  Administered 2020-08-23: 200 mg via INTRAVENOUS
  Filled 2020-08-23: qty 20

## 2020-08-23 MED ORDER — ALBUMIN HUMAN 5 % IV SOLN
25.0000 g | Freq: Once | INTRAVENOUS | Status: AC
  Administered 2020-08-23: 25 g via INTRAVENOUS
  Filled 2020-08-23: qty 500

## 2020-08-23 MED ORDER — DEXMEDETOMIDINE HCL IN NACL 400 MCG/100ML IV SOLN
0.4000 ug/kg/h | INTRAVENOUS | Status: DC
  Administered 2020-08-23: 0.4 ug/kg/h via INTRAVENOUS
  Filled 2020-08-23 (×2): qty 100

## 2020-08-23 MED ORDER — SODIUM CHLORIDE 0.9 % IV SOLN
100.0000 mg | Freq: Two times a day (BID) | INTRAVENOUS | Status: DC
  Filled 2020-08-23 (×2): qty 10

## 2020-08-23 MED ORDER — PROPOFOL 1000 MG/100ML IV EMUL
5.0000 ug/kg/min | INTRAVENOUS | Status: DC
  Administered 2020-08-23: 40 ug/kg/min via INTRAVENOUS
  Administered 2020-08-23: 30 ug/kg/min via INTRAVENOUS
  Administered 2020-08-24: 45 ug/kg/min via INTRAVENOUS
  Filled 2020-08-23 (×2): qty 100

## 2020-08-23 MED ORDER — VALPROATE SODIUM 100 MG/ML IV SOLN
500.0000 mg | Freq: Three times a day (TID) | INTRAVENOUS | Status: DC
  Administered 2020-08-24: 500 mg via INTRAVENOUS
  Filled 2020-08-23 (×3): qty 5

## 2020-08-23 MED ORDER — PERAMPANEL 8 MG PO TABS
16.0000 mg | ORAL_TABLET | Freq: Once | ORAL | Status: DC
  Filled 2020-08-23: qty 2

## 2020-08-23 MED ORDER — PERAMPANEL 8 MG PO TABS
16.0000 mg | ORAL_TABLET | Freq: Once | ORAL | Status: AC
  Administered 2020-08-23: 16 mg

## 2020-08-23 MED ORDER — SODIUM CHLORIDE 0.9 % IV BOLUS
1000.0000 mL | Freq: Once | INTRAVENOUS | Status: AC
  Administered 2020-08-23: 1000 mL via INTRAVENOUS

## 2020-08-23 MED ORDER — VALPROATE SODIUM 100 MG/ML IV SOLN
3000.0000 mg | Freq: Once | INTRAVENOUS | Status: AC
  Administered 2020-08-24: 3000 mg via INTRAVENOUS
  Filled 2020-08-23: qty 30

## 2020-08-23 MED ORDER — CALCIUM GLUCONATE-NACL 1-0.675 GM/50ML-% IV SOLN
1.0000 g | Freq: Once | INTRAVENOUS | Status: AC
  Administered 2020-08-23: 1000 mg via INTRAVENOUS
  Filled 2020-08-23: qty 50
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
## 2020-08-23 NOTE — Progress Notes (Signed)
HR 130s, Dr. Bedelia Person paged, increase propofol to 15-20.

## 2020-08-23 NOTE — Progress Notes (Signed)
Patient ID: Krista Hernandez, female   DOB: [birth year], 60 y.o.   MRN: [id number] I called her daughter, Krista Hernandez, and updated her at length. She reported that she took notes and that she will call Anette's sisters and update them.  Violeta Gelinas, MD, MPH, FACS Please use [email or web address] to contact on call provider

## 2020-08-23 NOTE — Progress Notes (Signed)
Patient with ongoing electrographic discharges with a frequency of 3-4 Hz, consistent with status epilepticus. Unfortunately, hemodynamic issues have limited aggressive barbiturate/propofol use, but I would favor attempting to terminate the pattern if possible. I would favor using pressors to attempt to get her to tolerate propofol, would avoid precedex as it lowers pressure with no antiepileptic benefit. Also given the long duration of status epilepticus, an agent with AMPA activity such as perampanel could have some benefit. Will load with 16mg  x 1, then 4mg  daily.   This patient is critically ill and at significant risk of neurological worsening, death and care requires constant monitoring of vital signs, hemodynamics,respiratory and cardiac monitoring, neurological assessment, discussion with family, other specialists and medical decision making of high complexity. I spent 35 minutes of neurocritical care time  in the care of  this patient. This was time spent independent of any time provided by nurse practitioner or PA.  , MD [facility] [phone]  If 7pm- 7am, please page neurology on call as listed in AMION. 08/23/2020  8:22 PM

## 2020-08-23 NOTE — Progress Notes (Signed)
Assisted tele visit to patient with daughter,Tiffany.  Vena Austria, RN

## 2020-08-23 NOTE — Progress Notes (Signed)
RT called to see pt d/t PIP in the 40's (40-45).  Pt suctioned multiple times removing moderate amounts of brown/tan secretions but not relieving the PIP issue.  Pt's BS very diminished on left side but obvious on right.  RT pulled ETT back one cm to 24 @ lip.  At this time PIP are at 35.  Pt BS not improved on left.  RN ordered chest xray to confirm placement is still appropriate.  RT will continue to monitor.

## 2020-08-23 NOTE — Progress Notes (Signed)
vLTM EEG maintenance is complete. No skin breakdown at FP1 FP2 F7. Continue to monitor

## 2020-08-23 NOTE — Progress Notes (Signed)
Patient ID: Krista Hernandez, female   DOB: Sep 09, 1960, 60 y.o.   MRN: [id number] ABG shows mixed acidosis. Increase RR to 26, give 2 amps bicarb.  Violeta Gelinas, MD, MPH, FACS Please use [email or web address] to contact on call provider
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
## 2020-08-23 NOTE — Progress Notes (Signed)
During MRI, gave of Fentanyl. Wasted the remaining into the sharps container, witnessed by Kathaleen Maser, RN.

## 2020-08-23 NOTE — Progress Notes (Addendum)
Inpatient Diabetes Program Recommendations  AACE/ADA: New Consensus Statement on Inpatient Glycemic Control (2015)  Target Ranges:  Prepandial:   less than 140 mg/dL      Peak postprandial:   less than 180 mg/dL (1-2 hours)      Critically ill patients:  140 - 180 mg/dL   Lab Results  Component Value Date   GLUCAP 155 (H) 08/23/2020    Review of Glycemic Control Results for LAITYN, BENSEN (MRN [id number]) as of 08/23/2020 08:41  Ref. Range 08/22/2020 15:20 08/22/2020 15:47 08/22/2020 20:03 08/22/2020 23:26 08/23/2020 03:58  Glucose-Capillary Latest Ref Range: 70 - 99 mg/dL 63 (L) 158 (H) 309 (H) 140 (H) 155 (H)   Current orders for Inpatient glycemic control:  None  Tube Feeds: Pivot 55 ml/hour  Inpatient Diabetes Program Recommendations:    - Add Novolog 0-6 units Q4 hours  Thanks,  Christena Deem RN, MSN, BC-ADM Inpatient Diabetes Coordinator Team Pager [phone] (8a-5p)
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
## 2020-08-23 NOTE — Progress Notes (Signed)
Per Dr. Amada Jupiter pt is having seizures, start propofol @ 30 increase by 10 Q6min to 50.

## 2020-08-23 NOTE — Progress Notes (Signed)
Appreciate neurology team's care.  Requiring propofol to maintain EEG pattern, but then pressors increased to maintain a blood pressure.  UOP dropped off this afternoon, CVP in the high 20s.  Some concern for mesenteric ischemia or other sequela of global hypoperfusion on top of her neurological issues.  Does not appear to be bleeding based on H+H trend.  CXR without evidence of free air.  Will continue aggressive care in ICU, however, prognosis is poor as has been discussed with family.

## 2020-08-24 ENCOUNTER — Inpatient Hospital Stay (HOSPITAL_COMMUNITY): Payer: BC Managed Care – PPO

## 2020-08-24 DIAGNOSIS — G40901 Epilepsy, unspecified, not intractable, with status epilepticus: Secondary | ICD-10-CM | POA: Diagnosis not present

## 2020-08-24 LAB — CBC
HCT: 30.9 % — ABNORMAL LOW (ref 36.0–46.0)
Hemoglobin: 9.8 g/dL — ABNORMAL LOW (ref 12.0–15.0)
MCH: 30.4 pg (ref 26.0–34.0)
MCHC: 31.7 g/dL (ref 30.0–36.0)
MCV: 96 fL (ref 80.0–100.0)
Platelets: 58 10*3/uL — ABNORMAL LOW (ref 150–400)
RBC: 3.22 MIL/uL — ABNORMAL LOW (ref 3.87–5.11)
RDW: 19.7 % — ABNORMAL HIGH (ref 11.5–15.5)
WBC: 4.5 10*3/uL (ref 4.0–10.5)
nRBC: 4.4 % — ABNORMAL HIGH (ref 0.0–0.2)

## 2020-08-24 LAB — BASIC METABOLIC PANEL
Anion gap: 17 — ABNORMAL HIGH (ref 5–15)
BUN: 32 mg/dL — ABNORMAL HIGH (ref 6–20)
CO2: 19 mmol/L — ABNORMAL LOW (ref 22–32)
Calcium: 5.9 mg/dL — CL (ref 8.9–10.3)
Chloride: 110 mmol/L (ref 98–111)
Creatinine, Ser: 2.96 mg/dL — ABNORMAL HIGH (ref 0.44–1.00)
GFR, Estimated: 18 mL/min — ABNORMAL LOW (ref >60)
Glucose, Bld: 226 mg/dL — ABNORMAL HIGH (ref 70–99)
Potassium: 3.5 mmol/L (ref 3.5–5.1)
Sodium: 146 mmol/L — ABNORMAL HIGH (ref 135–145)

## 2020-08-24 LAB — POCT I-STAT 7, (LYTES, BLD GAS, ICA,H+H)
Acid-base deficit: 21 mmol/L — ABNORMAL HIGH (ref 0.0–2.0)
Bicarbonate: 11 mmol/L — ABNORMAL LOW (ref 20.0–28.0)
Calcium, Ion: 0.95 mmol/L — ABNORMAL LOW (ref 1.15–1.40)
HCT: 32 % — ABNORMAL LOW (ref 36.0–46.0)
Hemoglobin: 10.9 g/dL — ABNORMAL LOW (ref 12.0–15.0)
O2 Saturation: 76 %
Patient temperature: 96.6
Potassium: 4.6 mmol/L (ref 3.5–5.1)
Sodium: 141 mmol/L (ref 135–145)
TCO2: 13 mmol/L — ABNORMAL LOW (ref 22–32)
pCO2 arterial: 47.9 mmHg (ref 32.0–48.0)
pH, Arterial: 6.962 — CL (ref 7.350–7.450)
pO2, Arterial: 59 mmHg — ABNORMAL LOW (ref 83.0–108.0)

## 2020-08-24 LAB — GLUCOSE, CAPILLARY
Glucose-Capillary: 188 mg/dL — ABNORMAL HIGH (ref 70–99)
Glucose-Capillary: 224 mg/dL — ABNORMAL HIGH (ref 70–99)

## 2020-08-24 LAB — VALPROIC ACID LEVEL: Valproic Acid Lvl: 134 ug/mL — ABNORMAL HIGH (ref 50.0–100.0)

## 2020-08-24 LAB — TRIGLYCERIDES: Triglycerides: 299 mg/dL — ABNORMAL HIGH (ref <150)

## 2020-08-24 MED ORDER — PHENYLEPHRINE CONCENTRATED 100MG/250ML (0.4 MG/ML) INFUSION SIMPLE
25.0000 ug/min | INTRAVENOUS | Status: DC
  Administered 2020-08-24: 50 ug/min via INTRAVENOUS
  Filled 2020-08-24 (×2): qty 250

## 2020-08-24 MED ORDER — PHENYLEPHRINE HCL-NACL 10-0.9 MG/250ML-% IV SOLN: 25.0000 ug/min | INTRAVENOUS | Status: DC

## 2020-08-24 MED ORDER — SODIUM CHLORIDE 0.9 % IV SOLN: 250.0000 mL | INTRAVENOUS | Status: DC

## 2020-08-24 MED ORDER — VASOPRESSIN 20 UNITS/100 ML INFUSION FOR SHOCK
0.0000 [IU]/min | INTRAVENOUS | Status: DC
  Administered 2020-08-24: 0.03 [IU]/min via INTRAVENOUS
  Filled 2020-08-24 (×2): qty 100

## 2020-08-24 MED ORDER — SODIUM BICARBONATE 8.4 % IV SOLN
INTRAVENOUS | Status: AC
  Administered 2020-08-24: 100 meq
  Filled 2020-08-24: qty 100

## 2020-08-24 MED ORDER — SODIUM BICARBONATE 8.4 % IV SOLN: 100.0000 meq | Freq: Once | INTRAVENOUS | Status: AC

## 2020-08-31 MED FILL — Medication: Qty: 1 | Status: AC

## 2020-09-06 NOTE — Progress Notes (Signed)
Chaplain responded to page.  No family present in COVID room.  Chaplain not needed at this time.  Vernell Morgans Chaplain

## 2020-09-06 NOTE — Progress Notes (Signed)
IVT: Responded to code blue. Pt with Access.

## 2020-09-06 NOTE — Progress Notes (Signed)
Pt not having any urine output, Dr. Royanne Foots aware, bolus ordered and CVP monitoring started. Will continue to monitor.

## 2020-09-06 NOTE — Progress Notes (Signed)
LTM discontinued; pt deceased; nurse removed the leads from patient and wheeled machine out to tech. Tech was not in room.
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
## 2020-09-06 NOTE — Progress Notes (Signed)
Lacosamide 50 mg IVPB wasted in main pharmacy.   PKP/Jessica
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
## 2020-09-06 NOTE — Progress Notes (Signed)
[id number] pt was in pulseless Vtach, compressions started and code called, epi and bicarb given. TOD called by Dr. Chesley Mires.

## 2020-09-06 NOTE — Progress Notes (Signed)
Increasing pressor requirements overnight.  Desaturation this morning.  ABG with worsening acidosis.  Giving 2 amps of bicarb and increasing respiratory rate to 28.  Called daughter Epiphany and explained the situation.  Stated I am concerned she may have a code event this morning.  Discussed code status options.  She will discuss with family then be back in contact with [place].

## 2020-09-06 DEATH — deceased
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
## 2021-06-20 IMAGING — DX DG CHEST 1V PORT
1 series · 1 of 1 positions shown · non-contrast
Comparison: 08/22/2020.

CLINICAL DATA: Respiratory failure.

EXAM:
PORTABLE CHEST 1 VIEW

[chest ap]
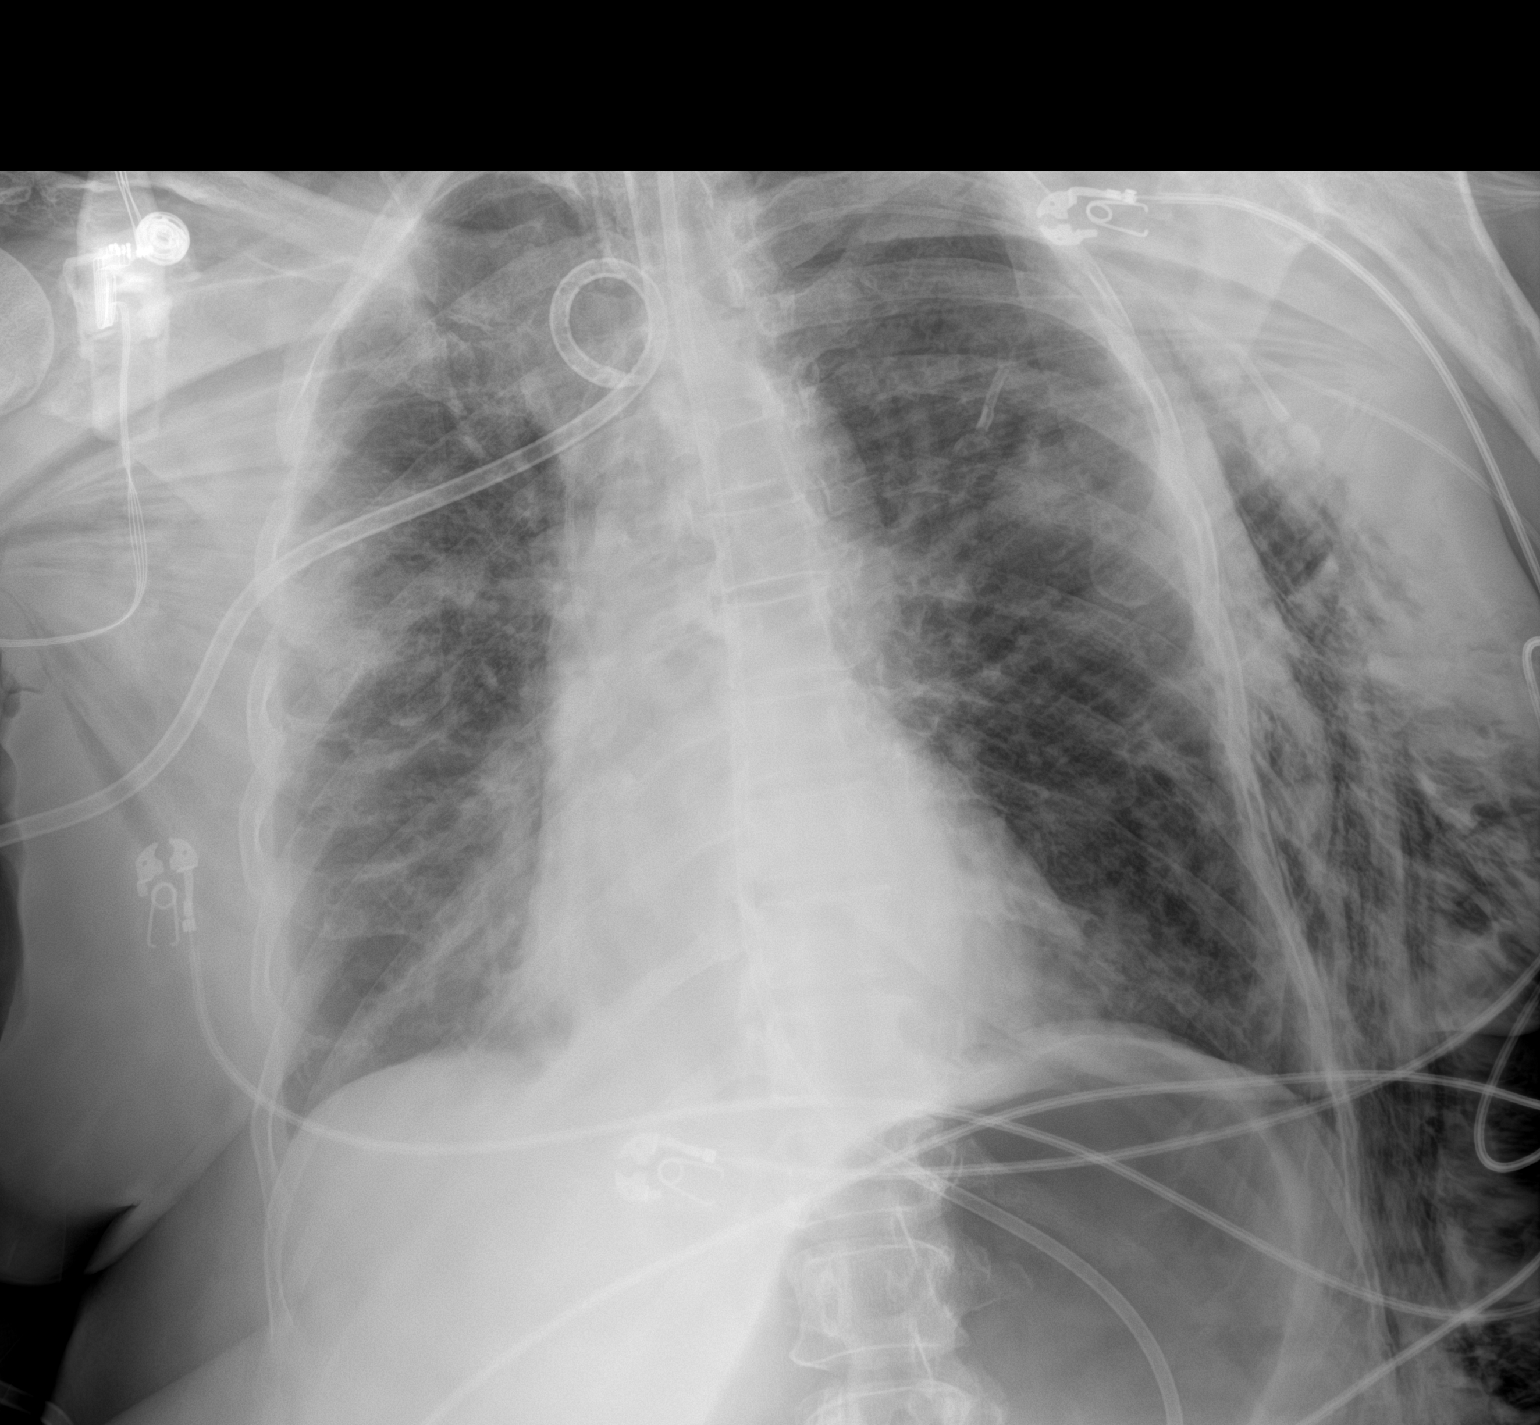

[1 of 1 positions shown; findings below may reference images not displayed]

FINDINGS: Interim removal of NG tube and placement of feeding tube. Tip is
below left hemidiaphragm endotracheal tube in stable position. Right
chest tube in stable position. Interim placement of PICC line, its
tip is over the SVC. Heart size normal. Bilateral interstitial
infiltrates again noted. Tiny right pleural effusion cannot be
excluded. No pneumothorax. Extensive chest wall subcutaneous
emphysema again noted. Mild gastric distention noted.
IMPRESSION: 1. Interim removal of NG tube and placement of feeding tube. Interim
placement of PICC line, its tip is over the SVC. Endotracheal tube
in stable position. Chest tube is in stable position.
2. Diffuse bilateral interstitial infiltrates again noted. Tiny
right pleural effusion cannot be excluded. No pneumothorax noted.
Extensive chest wall subcutaneous emphysema again noted.
3. Mild gastric distention noted.
